# Patient Record
Sex: Male | Born: 2000 | Race: Black or African American | Hispanic: No | Marital: Single | Smoking: Never smoker
Health system: Southern US, Community
[De-identification: ages and names within clinical notes are randomized; demographics above are authoritative.]

---
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
## 2019-07-29 ENCOUNTER — Ambulatory Visit: Payer: Self-pay | Attending: Internal Medicine

## 2019-08-02 ENCOUNTER — Ambulatory Visit: Payer: Medicaid Other | Attending: Internal Medicine

## 2019-08-02 DIAGNOSIS — Z23 Encounter for immunization: Secondary | ICD-10-CM

## 2019-08-02 NOTE — Progress Notes (Signed)
   Covid-19 Vaccination Clinic  Name:  Isaiah Vaughan    MRN: [id number] DOB: 06/15/2000  08/02/2019  Isaiah Vaughan was observed post Covid-19 immunization for 15 minutes without incident. He was provided with Vaccine Information Sheet and instruction to access the V-Safe system.   Isaiah Vaughan was instructed to call 911 with any severe reactions post vaccine: Marland Kitchen Difficulty breathing  . Swelling of face and throat  . A fast heartbeat  . A bad rash all over body  . Dizziness and weakness   Immunizations Administered    Name Date Dose VIS Date Route   Pfizer COVID-19 Vaccine 08/02/2019  5:59 PM 0.3 mL 05/03/2018 Intramuscular   Manufacturer: [employer], [employer]   Lot: [id number]   NDC: [address]-7

## 2019-08-23 ENCOUNTER — Ambulatory Visit: Payer: Medicaid Other | Attending: Internal Medicine

## 2019-08-23 DIAGNOSIS — Z23 Encounter for immunization: Secondary | ICD-10-CM

## 2019-08-23 NOTE — Progress Notes (Signed)
   Covid-19 Vaccination Clinic  Name:  Isaiah Vaughan    MRN: [id number] DOB: 03/31/2000  08/23/2019  Isaiah Vaughan was observed post Covid-19 immunization for 15 minutes without incident. He was provided with Vaccine Information Sheet and instruction to access the V-Safe system.   Isaiah Vaughan was instructed to call 911 with any severe reactions post vaccine: Marland Kitchen Difficulty breathing  . Swelling of face and throat  . A fast heartbeat  . A bad rash all over body  . Dizziness and weakness   Immunizations Administered    Name Date Dose VIS Date Route   Pfizer COVID-19 Vaccine 08/23/2019  4:16 PM 0.3 mL 05/03/2018 Intramuscular   Manufacturer: [employer], [employer]   Lot: [id number]   NDC: [address]-7

## 2020-10-09 ENCOUNTER — Emergency Department: Payer: Worker's Compensation

## 2020-10-09 ENCOUNTER — Encounter: Payer: Self-pay | Admitting: Emergency Medicine

## 2020-10-09 ENCOUNTER — Other Ambulatory Visit: Payer: Self-pay

## 2020-10-09 DIAGNOSIS — R52 Pain, unspecified: Secondary | ICD-10-CM

## 2020-10-09 DIAGNOSIS — S9032XA Contusion of left foot, initial encounter: Secondary | ICD-10-CM | POA: Insufficient documentation

## 2020-10-09 DIAGNOSIS — W240XXA Contact with lifting devices, not elsewhere classified, initial encounter: Secondary | ICD-10-CM | POA: Diagnosis not present

## 2020-10-09 DIAGNOSIS — Y990 Civilian activity done for income or pay: Secondary | ICD-10-CM | POA: Insufficient documentation

## 2020-10-09 DIAGNOSIS — Y9289 Other specified places as the place of occurrence of the external cause: Secondary | ICD-10-CM | POA: Diagnosis not present

## 2020-10-09 DIAGNOSIS — S99922A Unspecified injury of left foot, initial encounter: Secondary | ICD-10-CM | POA: Diagnosis present
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
## 2020-10-10 ENCOUNTER — Emergency Department
Admission: EM | Admit: 2020-10-10 | Discharge: 2020-10-10 | Disposition: A | Discharge status: Home / Self Care | Payer: Worker's Compensation | Attending: Emergency Medicine | Admitting: Emergency Medicine

## 2020-10-10 DIAGNOSIS — R52 Pain, unspecified: Secondary | ICD-10-CM

## 2020-10-10 DIAGNOSIS — S9032XA Contusion of left foot, initial encounter: Secondary | ICD-10-CM

## 2020-10-10 MED ORDER — IBUPROFEN 800 MG PO TABS: 800.0000 mg | ORAL_TABLET | Freq: Three times a day (TID) | ORAL | 0 refills | Status: AC | PRN

## 2020-10-10 MED ORDER — IBUPROFEN 800 MG PO TABS
800.0000 mg | ORAL_TABLET | Freq: Once | ORAL | Status: AC
  Administered 2020-10-10: 800 mg via ORAL
  Filled 2020-10-10: qty 1
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
## 2020-11-23 ENCOUNTER — Other Ambulatory Visit: Payer: Self-pay

## 2020-11-23 ENCOUNTER — Emergency Department
Admission: EM | Admit: 2020-11-23 | Discharge: 2020-11-23 | Disposition: A | Discharge status: Home / Self Care | Payer: Medicaid Other | Attending: Emergency Medicine | Admitting: Emergency Medicine

## 2020-11-23 ENCOUNTER — Emergency Department: Payer: Medicaid Other

## 2020-11-23 ENCOUNTER — Encounter: Payer: Self-pay | Admitting: Emergency Medicine

## 2020-11-23 DIAGNOSIS — Y92410 Unspecified street and highway as the place of occurrence of the external cause: Secondary | ICD-10-CM | POA: Insufficient documentation

## 2020-11-23 DIAGNOSIS — M25512 Pain in left shoulder: Secondary | ICD-10-CM | POA: Insufficient documentation

## 2020-11-23 DIAGNOSIS — S0990XA Unspecified injury of head, initial encounter: Secondary | ICD-10-CM | POA: Insufficient documentation

## 2020-11-23 DIAGNOSIS — Z5321 Procedure and treatment not carried out due to patient leaving prior to being seen by health care provider: Secondary | ICD-10-CM | POA: Insufficient documentation
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
## 2021-09-04 ENCOUNTER — Ambulatory Visit: Payer: BC Managed Care – PPO

## 2021-09-15 ENCOUNTER — Ambulatory Visit: Payer: BC Managed Care – PPO
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
